# Patient Record
Sex: Female | Born: 2000 | Race: White | Hispanic: No | Marital: Single | Smoking: Never smoker
Health system: Southern US, Community
[De-identification: ages and names within clinical notes are randomized; demographics above are authoritative.]

---
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
## 2019-04-12 ENCOUNTER — Ambulatory Visit: Payer: 59 | Attending: Internal Medicine

## 2019-04-12 DIAGNOSIS — Z23 Encounter for immunization: Secondary | ICD-10-CM

## 2019-04-12 NOTE — Progress Notes (Signed)
   Covid-19 Vaccination Clinic  Name:  Amber Rhodes    MRN: [id number] DOB: 21-Nov-2000  04/12/2019  [name] was observed post Covid-19 immunization for 15 minutes without incident. She was provided with Vaccine Information Sheet and instruction to access the V-Safe system.   [name] was instructed to call 911 with any severe reactions post vaccine: Marland Kitchen Difficulty breathing  . Swelling of face and throat  . A fast heartbeat  . A bad rash all over body  . Dizziness and weakness   Immunizations Administered    Name Date Dose VIS Date Route   Pfizer COVID-19 Vaccine 04/12/2019  2:39 PM 0.3 mL 01/01/2019 Intramuscular   Manufacturer: [employer], [employer]   Lot: [id number]   NDC: [address]-1

## 2019-05-03 ENCOUNTER — Ambulatory Visit: Payer: 59 | Attending: Internal Medicine

## 2019-05-03 DIAGNOSIS — Z23 Encounter for immunization: Secondary | ICD-10-CM

## 2019-05-03 NOTE — Progress Notes (Signed)
   Covid-19 Vaccination Clinic  Name:  Amber Rhodes    MRN: [id number] DOB: 12-Jul-2000  05/03/2019  [name] was observed post Covid-19 immunization for 15 minutes without incident. She was provided with Vaccine Information Sheet and instruction to access the V-Safe system.   [name] was instructed to call 911 with any severe reactions post vaccine: Marland Kitchen Difficulty breathing  . Swelling of face and throat  . A fast heartbeat  . A bad rash all over body  . Dizziness and weakness   Immunizations Administered    Name Date Dose VIS Date Route   Pfizer COVID-19 Vaccine 05/03/2019  2:30 PM 0.3 mL 01/01/2019 Intramuscular   Manufacturer: [employer], [employer]   Lot: [id number]   NDC: [address]-0

## 2020-01-19 DIAGNOSIS — F32A Depression, unspecified: Secondary | ICD-10-CM | POA: Insufficient documentation

## 2020-01-19 DIAGNOSIS — F909 Attention-deficit hyperactivity disorder, unspecified type: Secondary | ICD-10-CM | POA: Insufficient documentation

## 2020-09-09 ENCOUNTER — Emergency Department
Admission: EM | Admit: 2020-09-09 | Discharge: 2020-09-09 | Disposition: A | Discharge status: Home / Self Care | Payer: 59 | Attending: Emergency Medicine | Admitting: Emergency Medicine

## 2020-09-09 ENCOUNTER — Emergency Department: Payer: 59

## 2020-09-09 ENCOUNTER — Other Ambulatory Visit: Payer: Self-pay

## 2020-09-09 DIAGNOSIS — M4306 Spondylolysis, lumbar region: Secondary | ICD-10-CM

## 2020-09-09 DIAGNOSIS — M545 Low back pain, unspecified: Secondary | ICD-10-CM | POA: Diagnosis present

## 2020-09-09 LAB — POC URINE PREG, ED: Preg Test, Ur: NEGATIVE

## 2020-09-09 MED ORDER — ACETAMINOPHEN 500 MG PO TABS
1000.0000 mg | ORAL_TABLET | Freq: Once | ORAL | Status: AC
  Administered 2020-09-09: 1000 mg via ORAL
  Filled 2020-09-09: qty 2

## 2020-09-09 MED ORDER — LIDOCAINE 5 % EX PTCH
1.0000 | MEDICATED_PATCH | CUTANEOUS | Status: DC
  Administered 2020-09-09: 1 via TRANSDERMAL
  Filled 2020-09-09: qty 1

## 2020-09-09 NOTE — ED Notes (Signed)
Patient transported to X-ray 

## 2020-09-09 NOTE — ED Triage Notes (Signed)
Pt states that over a month ago she was lifting 50 pounds in the gym and her lower back spasmed- states she saw an orthopedist and student health and they told her it was a pulled muscle. Pt states she's been taking muscle relaxer's and the pain has gotten worse and spread to hips. Pt states she cries herself to sleep bc of the pain. Pt is AOX4, NAD noted, pt ambulatory without difficulty.

## 2020-09-09 NOTE — ED Provider Notes (Signed)
[facility] Emergency Department Provider Note  ____________________________________________   Event Date/Time   First MD Initiated Contact with Patient 09/09/20 [phone]     (approximate)  I have reviewed the triage vital signs and the nursing notes.   HISTORY  Chief Complaint No chief complaint on file.   HPI Amber Rhodes is a 20 y.o. female without significant past medical history presents for assessment of some fairly severe persistent subacute low back pain.  Patient states that started a little over a month ago when she was lifting weights at the gym when she bent over and felt sudden spasm in her low mid back.  She states she went to an orthopedist back home in [place] assess where she is from and is currently a student here an orthopedist on x-ray that did not show any fracture or disc herniation and was told it was a muscle pain.  However she states that this was almost 3 weeks ago and since then the pain has not improved and she is already got a student health twice and has been prescribed muscle relaxants but these are also not helping.  She denies any interim injuries, incontinence, lower extremity weakness numbness or tingling, fevers, abdominal pain, upper back pain, rash, drug use, significant recent steroid use or other acute sick symptoms.  She has been taking Aleve and flexeril  without significant relief.  No other associated recent sick symptoms including vomiting, chest pain, cough, shortness of breath, fevers or other acute sick symptoms.  No other acute concerns at this time.         History reviewed. No pertinent past medical history.  There are no problems to display for this patient.   History reviewed. No pertinent surgical history.  Prior to Admission medications   Not on File    Allergies Patient has no allergy information on record.  History reviewed. No pertinent family history.  Social History Social History   Tobacco  Use   Smoking status: Never   Smokeless tobacco: Never    Review of Systems  Review of Systems  Constitutional:  Negative for chills and fever.  HENT:  Negative for sore throat.   Eyes:  Negative for pain.  Respiratory:  Negative for cough and stridor.   Cardiovascular:  Negative for chest pain.  Gastrointestinal:  Negative for vomiting.  Genitourinary:  Negative for dysuria.  Musculoskeletal:  Positive for back pain.  Skin:  Negative for rash.  Neurological:  Negative for seizures, loss of consciousness and headaches.  Psychiatric/Behavioral:  Negative for suicidal ideas.   All other systems reviewed and are negative.    ____________________________________________   PHYSICAL EXAM:  VITAL SIGNS: ED Triage Vitals  Enc Vitals Group     BP 09/09/20 1649 126/74     Pulse Rate 09/09/20 1649 98     Resp 09/09/20 1649 16     Temp 09/09/20 1649 98.7 F (37.1 C)     Temp Source 09/09/20 1649 Oral     SpO2 09/09/20 1649 100 %     Weight 09/09/20 1644 140 lb (63.5 kg)     Height 09/09/20 1644 5\' 7"  (1.702 m)     Head Circumference --      Peak Flow --      Pain Score 09/09/20 1649 10     Pain Loc --      Pain Edu? --      Excl. in [place]? --    Vitals:  09/09/20 1649  BP: 126/74  Pulse: 98  Resp: 16  Temp: 98.7 F (37.1 C)  SpO2: 100%   Physical Exam Vitals and nursing note reviewed.  Constitutional:      General: She is not in acute distress.    Appearance: She is well-developed.  HENT:     Head: Normocephalic and atraumatic.     Right Ear: External ear normal.     Left Ear: External ear normal.     Nose: Nose normal.     Mouth/Throat:     Mouth: Mucous membranes are moist.  Eyes:     Conjunctiva/sclera: Conjunctivae normal.  Cardiovascular:     Rate and Rhythm: Normal rate and regular rhythm.     Heart sounds: No murmur heard. Pulmonary:     Effort: Pulmonary effort is normal. No respiratory distress.     Breath sounds: Normal breath sounds.  Abdominal:      Palpations: Abdomen is soft.     Tenderness: There is no abdominal tenderness.  Musculoskeletal:     Cervical back: Neck supple.  Skin:    General: Skin is warm and dry.     Capillary Refill: Capillary refill takes less than 2 seconds.  Neurological:     Mental Status: She is alert and oriented to person, place, and time.  Psychiatric:        Mood and Affect: Mood normal.    Patient has full symmetric strength in her bilateral extremities.  Sensation is intact to light touch of both lower extremities.  Some mild tenderness over the L-spine and bilateral paralumbar lumbar muscles without any overlying skin changes.  Patient states straight leg testing makes it slightly worse although pain does not radiate past the knee on either side.  Patient was observed to ambulate with steady gait to the restroom and back.  2+ patellar reflexes.  She appears well perfused in both lower extremities. ____________________________________________   LABS (all labs ordered are listed, but only abnormal results are displayed)  Labs Reviewed  POC URINE PREG, ED   ____________________________________________  EKG  ____________________________________________  RADIOLOGY  ED MD interpretation: Plain film of the L-spine shows unilateral pars defect at L5 without evidence of spondylolisthesis.  No other acute fracture dislocation or acute abnormalities noted.  Official radiology report(s): DG Lumbar Spine Complete W/Bend  Result Date: 09/09/2020 CLINICAL DATA:  Lifting injury 1 month ago, back pain EXAM: LUMBAR SPINE - COMPLETE WITH BENDING VIEWS COMPARISON:  None. FINDINGS: Frontal, bilateral oblique, lateral neutral, lateral flexion, and lateral extension views of the lumbar spine are obtained. There are 5 non-rib-bearing lumbar type vertebral bodies with gentle right convex scoliosis centered at L3. Otherwise alignment is anatomic. There are no acute displaced fractures. Small limbus vertebra is seen  at the superior anterior margin of the L4 vertebral body. Probable unilateral pars defect on the right at L5, without evidence of spondylolisthesis, of uncertain acuity. No instability with flexion or extension. Disc spaces are well preserved. IMPRESSION: 1. Unilateral right-sided pars defect at L5, without evidence of spondylolisthesis. 2. Minimal right convex lumbar scoliosis. 3. Otherwise unremarkable lumbar spine. Electronically Signed   By: Sharlet Salina M.D.   On: 09/09/2020 19:39    ____________________________________________   PROCEDURES  Procedure(s) performed (including Critical Care):  Procedures   ____________________________________________   INITIAL IMPRESSION / ASSESSMENT AND PLAN / ED COURSE      Patient presents with above to history exam for assessment of subacute low back pain that she states began after  bending forward while lifting weights.  Has not been getting better and she feels an anything getting slightly worse and is aggravated by bending forward.  On exam she has no focal deficits or abnormal reflexes.  She has some mild tenderness over the bilateral lumbar muscles in the L-spine but no overlying skin changes.  She denies any constitutional symptoms and is afebrile hemodynamically stable on arrival.  I agree with patient that this point is likely less likely a strained muscle and more concerned about possible disc herniation and radicular nerve impingement.  Duration of symptoms with absence of any other associated sick symptoms and reassuring exam is less concerning at this time for abscess, discitis, acute cord compression or other immediate life-threatening process.  There is no evidence of cellulitis and I have a low suspicion for significant metabolic derangement.  Plain film of the L-spine shows unilateral pars defect at L5 without evidence of spondylolisthesis.  No other acute fracture dislocation or acute abnormalities noted.  Given findings on plain film  and suspect this may be causing some nerve impingement of patient's pain.  However given duration of symptoms with otherwise reassuring exam and no focal deficits do not believe she requires admission or other emergent work-up and I think she is stable for discharge with close outpatient orthopedic follow-up.  Discussed adding Lyrica to her pain management as well as Tylenol as she has not been taking either of these although she is currently prescribed Lyrica for anxiety.  Also advised adding lidocaine patch.  She is amenable with plan.  Discharged stable condition.  Strict return precautions advised and discussed.      ____________________________________________   FINAL CLINICAL IMPRESSION(S) / ED DIAGNOSES  Final diagnoses:  Acute low back pain without sciatica, unspecified back pain laterality  Pars defect of lumbar spine    Medications  lidocaine (LIDODERM) 5 % 1 patch (1 patch Transdermal Patch Applied 09/09/20 1941)  acetaminophen (TYLENOL) tablet 1,000 mg (1,000 mg Oral Given 09/09/20 1939)     ED Discharge Orders     None        Note:  This document was prepared using Dragon voice recognition software and may include unintentional dictation errors.    Gilles Chiquito, MD 09/09/20 [id number]

## 2020-10-04 ENCOUNTER — Ambulatory Visit: Payer: Self-pay | Admitting: *Deleted

## 2020-10-04 NOTE — Telephone Encounter (Signed)
Pt reports seen in ED 09/09/20 for severe lower back pain. States L5 fx.'Pars defect of lumbar spine.'  States worsening pain, now radiates to hips and legs. 8/10 constant. Was told to return to ED if pain continues or worsens. Advised ED. Pt states she was just trying to get advise on any other treatments. Reiterated need for ED eval. Pt is visiting. No PCP. May do tele visit.

## 2020-10-04 NOTE — Telephone Encounter (Signed)
Reason for Disposition  [1] SEVERE back pain (e.g., excruciating, unable to do any normal activities) AND [2] not improved 2 hours after pain medicine  Answer Assessment - Initial Assessment Questions 1. ONSET: "When did the pain begin?"      09/09/20 2. LOCATION: "Where does it hurt?" (upper, mid or lower back)     Lower back 3. SEVERITY: "How bad is the pain?"  (e.g., Scale 1-10; mild, moderate, or severe)   - MILD (1-3): doesn't interfere with normal activities    - MODERATE (4-7): interferes with normal activities or awakens from sleep    - SEVERE (8-10): excruciating pain, unable to do any normal activities      6-7/10 4. PATTERN: "Is the pain constant?" (e.g., yes, no; constant, intermittent)      constant 5. RADIATION: "Does the pain shoot into your legs or elsewhere?"     To hips and legs 6. CAUSE:  "What do you think is causing the back pain?"      L5 fx 7. BACK OVERUSE:  "Any recent lifting of heavy objects, strenuous work or exercise?"     Maybe in gym 8. MEDICATIONS: "What have you taken so far for the pain?" (e.g., nothing, acetaminophen, NSAIDS)     Lidocaine 9. NEUROLOGIC SYMPTOMS: "Do you have any weakness, numbness, or problems with bowel/bladder control?"     no 10. OTHER SYMPTOMS: "Do you have any other symptoms?" (e.g., fever, abdominal pain, burning with urination, blood in urine)       no  Protocols used: Back Pain-A-AH

## 2020-10-12 ENCOUNTER — Other Ambulatory Visit: Payer: Self-pay | Admitting: Neurosurgery

## 2020-10-12 DIAGNOSIS — M4306 Spondylolysis, lumbar region: Secondary | ICD-10-CM

## 2020-10-12 DIAGNOSIS — M5441 Lumbago with sciatica, right side: Secondary | ICD-10-CM

## 2020-10-12 DIAGNOSIS — M5416 Radiculopathy, lumbar region: Secondary | ICD-10-CM

## 2020-10-23 ENCOUNTER — Ambulatory Visit
Admission: RE | Admit: 2020-10-23 | Discharge: 2020-10-23 | Disposition: A | Discharge status: Home / Self Care | Payer: 59 | Source: Ambulatory Visit | Attending: Neurosurgery | Admitting: Neurosurgery

## 2020-10-23 ENCOUNTER — Other Ambulatory Visit: Payer: Self-pay

## 2020-10-23 DIAGNOSIS — M5416 Radiculopathy, lumbar region: Secondary | ICD-10-CM | POA: Diagnosis not present

## 2020-10-23 DIAGNOSIS — M4306 Spondylolysis, lumbar region: Secondary | ICD-10-CM | POA: Diagnosis present

## 2020-10-23 DIAGNOSIS — M5442 Lumbago with sciatica, left side: Secondary | ICD-10-CM | POA: Diagnosis present

## 2020-10-23 DIAGNOSIS — M5441 Lumbago with sciatica, right side: Secondary | ICD-10-CM | POA: Insufficient documentation

## 2021-05-04 ENCOUNTER — Ambulatory Visit
Admission: EM | Admit: 2021-05-04 | Discharge: 2021-05-04 | Disposition: A | Discharge status: Home / Self Care | Payer: 59

## 2021-05-04 ENCOUNTER — Ambulatory Visit (INDEPENDENT_AMBULATORY_CARE_PROVIDER_SITE_OTHER): Payer: 59

## 2021-05-04 ENCOUNTER — Ambulatory Visit: Payer: 59

## 2021-05-04 DIAGNOSIS — S62636A Displaced fracture of distal phalanx of right little finger, initial encounter for closed fracture: Secondary | ICD-10-CM

## 2021-05-04 DIAGNOSIS — S62639A Displaced fracture of distal phalanx of unspecified finger, initial encounter for closed fracture: Secondary | ICD-10-CM

## 2021-05-04 MED ORDER — NAPROXEN 375 MG PO TABS: 375.0000 mg | ORAL_TABLET | Freq: Two times a day (BID) | ORAL | 0 refills | Status: AC

## 2021-05-04 NOTE — ED Triage Notes (Signed)
Patient presents to Urgent Care with complaints of jamming her right pink finger on table she was cleaning yesterday. She states pain worse yesterday, continues to have some redness.  ?

## 2021-05-04 NOTE — Discharge Instructions (Addendum)
Wear finger splint with all activities. ?May remove with bathing. ?Take Naproxen 375 mg twice daily for 7 days. ?  ?

## 2021-05-04 NOTE — ED Provider Notes (Signed)
?[facility] ? ? ? ?CSN: [id number] ?Arrival date & time: 05/04/21  1548 ? ? ?  ? ?History   ?Chief Complaint ?Chief Complaint  ?Patient presents with  ? Finger Injury  ?  Right pinky   ? ? ?HPI ?Amber Rhodes is a 21 y.o. female.  ? ?HPI ?Patient presents today with a right fifth digit injury.  Patient reports jamming her pinky into a table while working yesterday.  She reports the pain was present yesterday however overnight significantly worsened and now her fifth digit is reddened.  Pain with range of motion.  Needs imaging to rule out fracture. ?History reviewed. No pertinent past medical history. ? ?Patient Active Problem List  ? Diagnosis Date Noted  ? Attention deficit hyperactivity disorder 01/19/2020  ? Chronic depression 01/19/2020  ? Urinary tract infectious disease 08/13/2017  ? Candidiasis of vagina 11/06/2016  ? Vaginitis 11/06/2016  ? Disorder of breast 05/27/2016  ? ? ?History reviewed. No pertinent surgical history. ? ?OB History   ?No obstetric history on file. ?  ? ? ? ?Home Medications   ? ?Prior to Admission medications   ?Medication Sig Start Date End Date Taking? Authorizing Provider  ?amitriptyline (ELAVIL) 10 MG tablet TAKE 1 TABLET BY MOUTH EVERY DAY AT BEDTIME AS NEEDED 04/10/21  Yes [provider]  ?amphetamine-dextroamphetamine (ADDERALL) 10 MG tablet PLEASE SEE ATTACHED FOR DETAILED DIRECTIONS 06/25/20  Yes [provider]  ?doxepin (SINEQUAN) 10 MG capsule TAKE 1 CAPSULE BY MOUTH EVERY DAY AT BEDTIME AS NEEDED 02/28/21  Yes [provider]  ?gabapentin (NEURONTIN) 100 MG capsule TAKE 2 CAPSULES BY MOUTH TWICE A DAY AS NEEDED 01/28/20  Yes [provider]  ?naproxen (NAPROSYN) 375 MG tablet Take 1 tablet (375 mg total) by mouth 2 (two) times daily. 05/04/21  Yes Scot Jun, FNP  ?amphetamine-dextroamphetamine (ADDERALL) 5 MG tablet TAKE 1 TABLET BY MOUTH TWICE A DAY SECOND DOSE NOT AFTER 5PM TAKE WITH 10MG  TABLET    [provider]   ?PROZAC 20 MG capsule  04/25/21   [provider]  ? ? ?Family History ?History reviewed. No pertinent family history. ? ?Social History ?Social History  ? ?Tobacco Use  ? Smoking status: Never  ? Smokeless tobacco: Never  ?Vaping Use  ? Vaping Use: Some days  ?Substance Use Topics  ? Alcohol use: Never  ? Drug use: Never  ? ? ? ?Allergies   ?Patient has no known allergies. ? ? ?Review of Systems ?Review of Systems ?Pertinent negatives listed in HPI  ? ?Physical Exam ?Triage Vital Signs ?ED Triage Vitals [05/04/21 1610]  ?Enc Vitals Group  ?   BP   ?   Pulse   ?   Resp   ?   Temp   ?   Temp src   ?   SpO2   ?   Weight   ?   Height   ?   Head Circumference   ?   Peak Flow   ?   Pain Score 3  ?   Pain Loc   ?   Pain Edu?   ?   Excl. in [place]?   ? ?No data found. ? ?Updated Vital Signs ?BP 120/76 (BP Location: Left Arm)   Pulse (!) 107   Temp 98.2 ?F (36.8 ?C) (Temporal)   Resp 18   SpO2 99%  ? ?Visual Acuity ?Right Eye Distance:   ?Left Eye Distance:   ?Bilateral Distance:   ? ?Right  Eye Near:   ?Left Eye Near:    ?Bilateral Near:    ? ?Physical Exam ?Vitals reviewed.  ?Constitutional:   ?   Appearance: Normal appearance.  ?HENT:  ?   Head: Normocephalic and atraumatic.  ?   Right Ear: Hearing normal.  ?   Left Ear: Hearing normal.  ?   Nose: Nose normal.  ?   Mouth/Throat:  ?   Mouth: Mucous membranes are moist.  ?Cardiovascular:  ?   Rate and Rhythm: Normal rate and regular rhythm.  ?Pulmonary:  ?   Effort: Pulmonary effort is normal.  ?   Breath sounds: Normal breath sounds.  ?Musculoskeletal:  ?     Arms: ? ?Neurological:  ?   Mental Status: She is alert.  ?Psychiatric:     ?   Attention and Perception: Attention normal.     ?   Mood and Affect: Mood normal.     ?   Speech: Speech normal.     ?   Behavior: Behavior normal.  ? ?UC Treatments / Results  ?Labs ?(all labs ordered are listed, but only abnormal results are displayed) ?Labs Reviewed - No data to display ? ?EKG ? ? ?Radiology ?DG Finger Little  Right ? ?Result Date: 05/04/2021 ?CLINICAL DATA:  Right fifth digit injury yesterday, limited range of motion EXAM: RIGHT LITTLE FINGER 2+V COMPARISON:  None. FINDINGS: Frontal, oblique, and lateral views of the right fifth digit are obtained. On the lateral view there is slight dorsal subluxation of the fifth distal interphalangeal joint. Slight cortical irregularity along the volar aspect of the base of the fifth distal phalanx could reflect a small avulsion fracture. No other acute bony abnormalities. Mild soft tissue swelling. IMPRESSION: 1. Suspected small avulsion fracture volar aspect base of the fifth distal phalanx. 2. Slight dorsal subluxation of the fifth distal interphalangeal joint. 3. Soft tissue swelling. Electronically Signed   By: Randa Ngo M.D.   On: 05/04/2021 17:08   ? ?Procedures ?Procedures (including critical care time) ? ?Medications Ordered in UC ?Medications - No data to display ? ?Initial Impression / Assessment and Plan / UC Course  ?I have reviewed the triage vital signs and the nursing notes. ? ?Pertinent labs & imaging results that were available during my care of the patient were reviewed by me and considered in my medical decision making (see chart for details). ? ?  ?Avulsion fracture of the fifth right finger ?Finger splint. ?Naproxen 375 mg BID X 7 days. ?Follow-up with [facility]. ?RTC PRN  ?Final Clinical Impressions(s) / UC Diagnoses  ? ?Final diagnoses:  ?Avulsion fracture of distal phalanx of finger, closed, initial encounter  ? ? ? ?Discharge Instructions   ? ?  ?Wear finger splint with all activities. ?May remove with bathing. ?Take Naproxen 375 mg twice daily for 7 days. ?  ? ? ? ? ?ED Prescriptions   ? ? Medication Sig Dispense Auth. Provider  ? naproxen (NAPROSYN) 375 MG tablet Take 1 tablet (375 mg total) by mouth 2 (two) times daily. 20 tablet Scot Jun, FNP  ? ?  ? ?PDMP not reviewed this encounter. ?  ?Scot Jun, FNP ?05/04/21 1735 ? ?

## 2023-01-28 IMAGING — MR MR LUMBAR SPINE W/O CM
5 series · 31 of 48 positions shown · non-contrast
Comparison: Radiograph from 09/09/2020.

CLINICAL DATA: Initial evaluation for low back pain since lifting
injury, pain and tingling in both lower extremities.

EXAM:
MRI LUMBAR SPINE WITHOUT CONTRAST
TECHNIQUE: Multiplanar, multisequence MR imaging of the lumbar spine was
performed. No intravenous contrast was administered.

[Series 5: T2 · sagittal · 4.0mm · 0.81mm/px · 6 of 17 slices shown (1 of 2)]
[im 1/17]
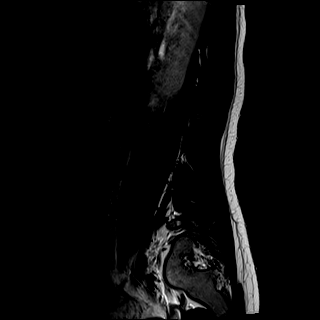
[im 4/17]
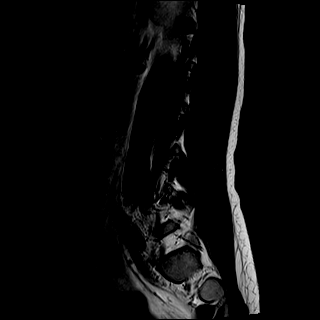
[im 7/17]
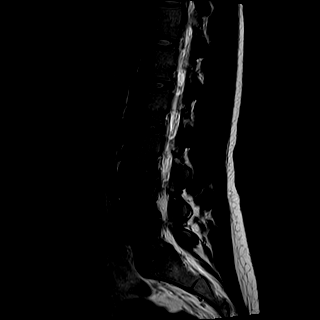
[im 10/17]
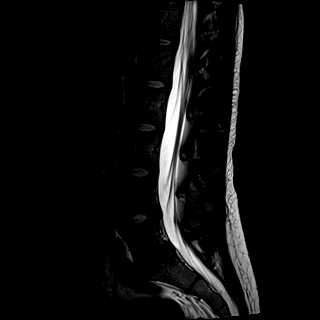
[im 13/17]
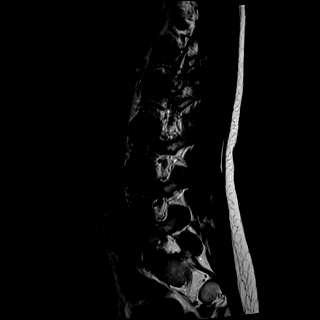
[im 17/17]
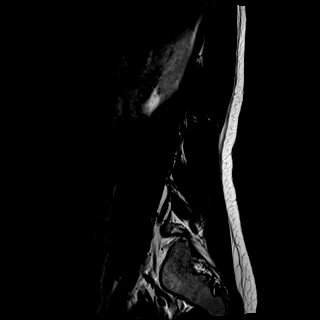

[Series 6: T1 · sagittal · 4.0mm · 0.81mm/px · 7 of 17 slices shown (1 of 2)]
[im 1/17]
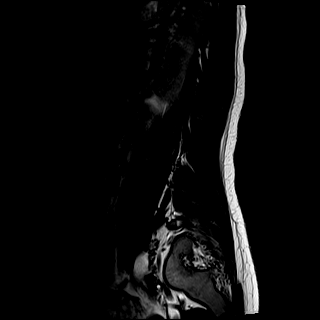
[im 3/17]
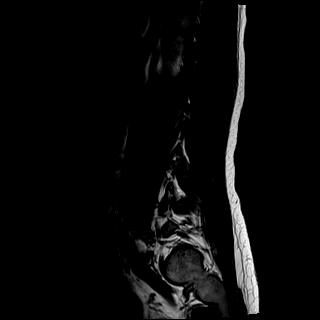
[im 6/17]
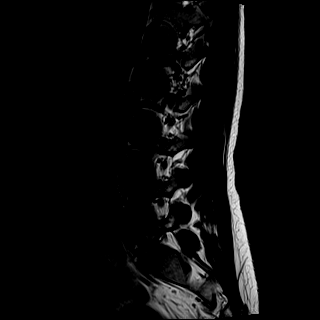
[im 9/17]
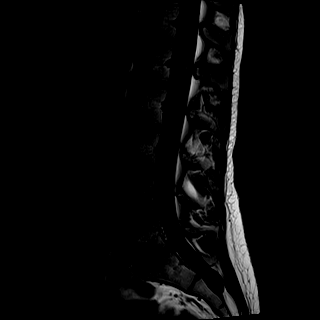
[im 11/17]
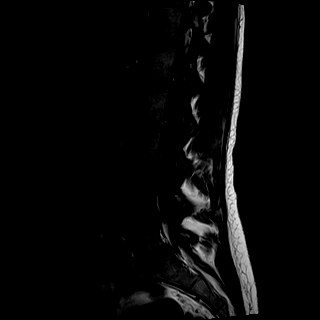
[im 14/17]
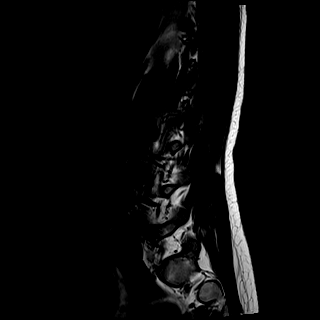
[im 17/17]
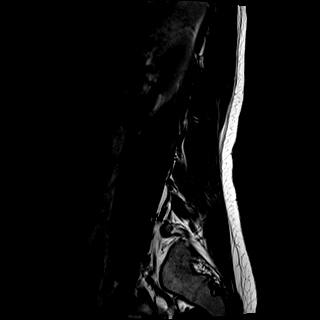

[Series 7: STIR · sagittal · 4.0mm · 0.41mm/px · 2 of 17 slices shown]
[im 1/17]
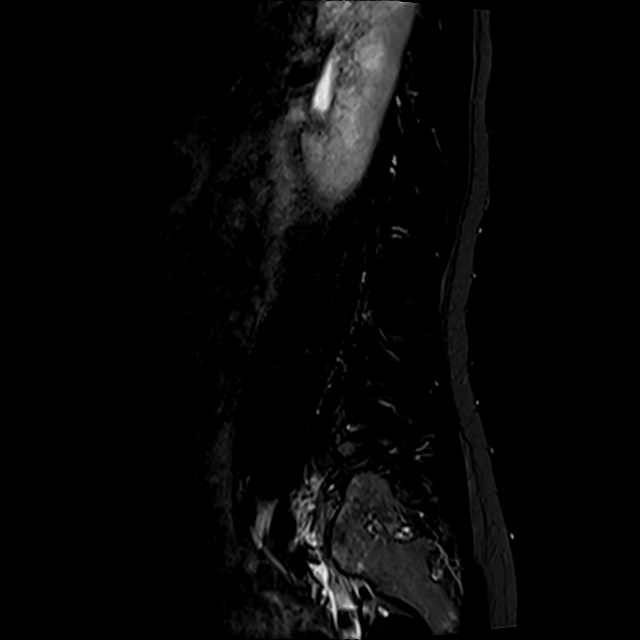
[im 3/17]
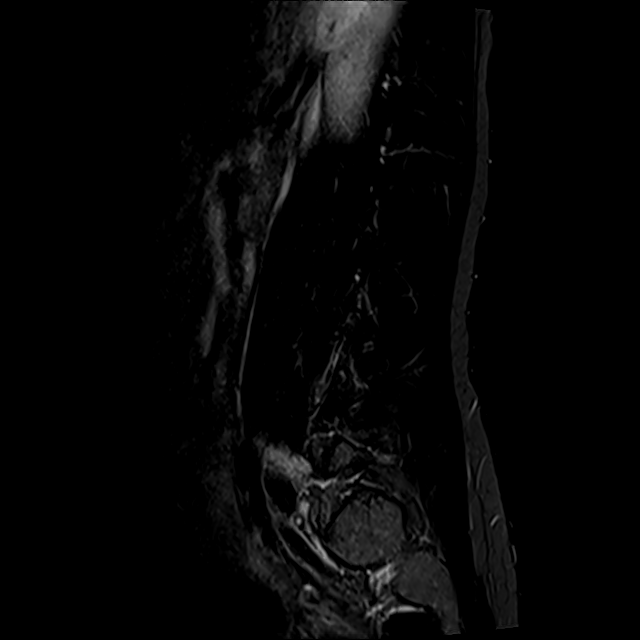

[Series 8: T2 · axial · 4.0mm · 0.78mm/px · z∈[-161,+57]mm · 8 of 37 slices shown (2 of 2)]
[im 1/37]
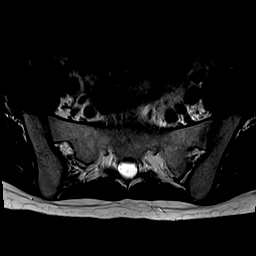
[im 6/37]
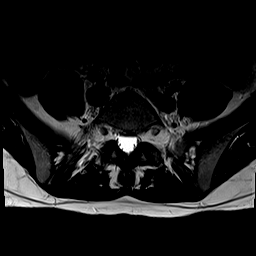
[im 12/37]
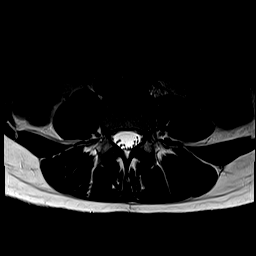
[im 17/37]
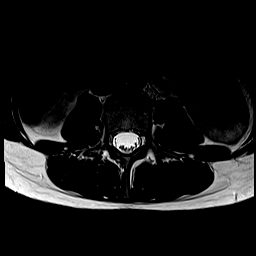
[im 20/37]
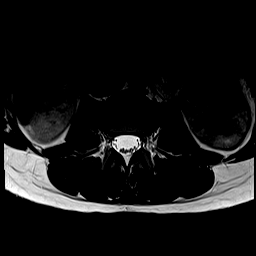
[im 25/37]
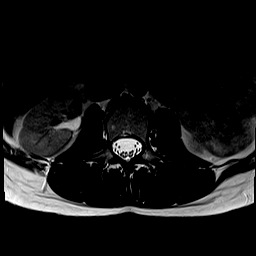
[im 31/37]
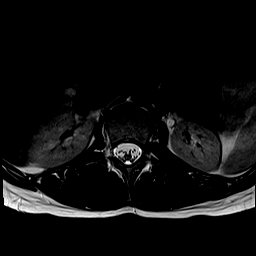
[im 37/37]
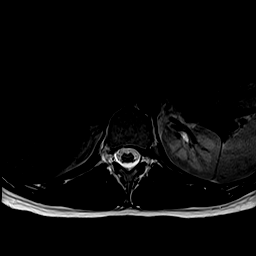

[Series 9: T1 · axial · 4.0mm · 0.39mm/px · z∈[-161,+57]mm · 8 of 37 slices shown (2 of 2)]
[im 1/37]
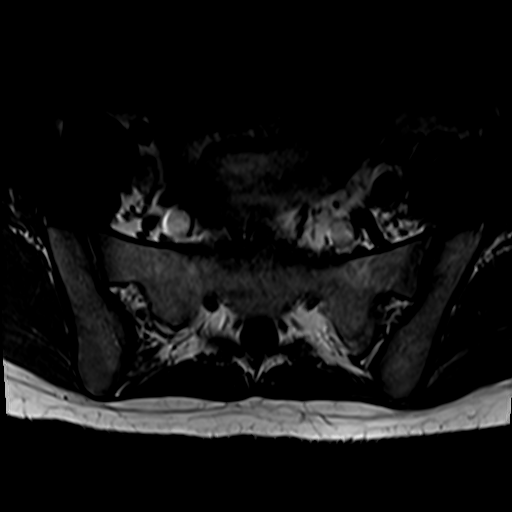
[im 6/37]
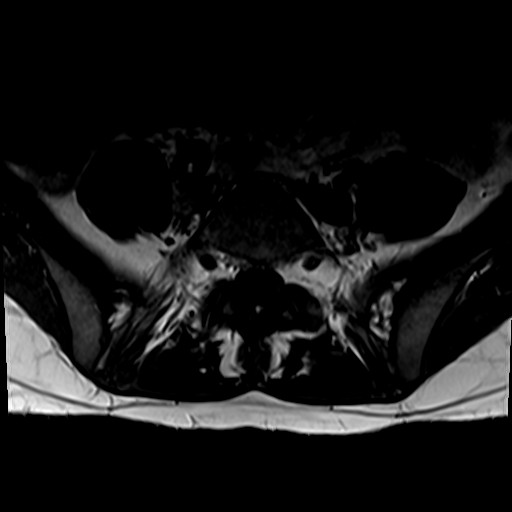
[im 12/37]
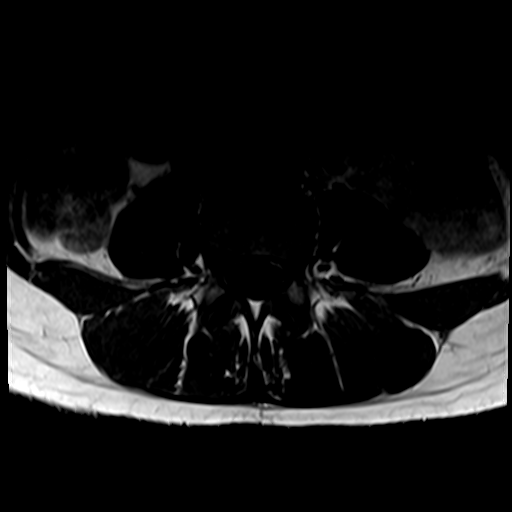
[im 17/37]
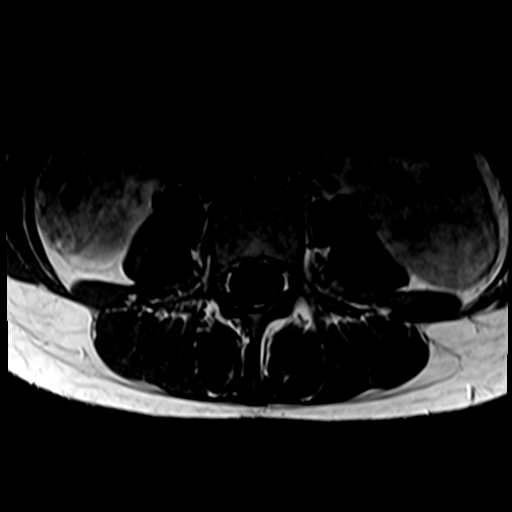
[im 20/37]
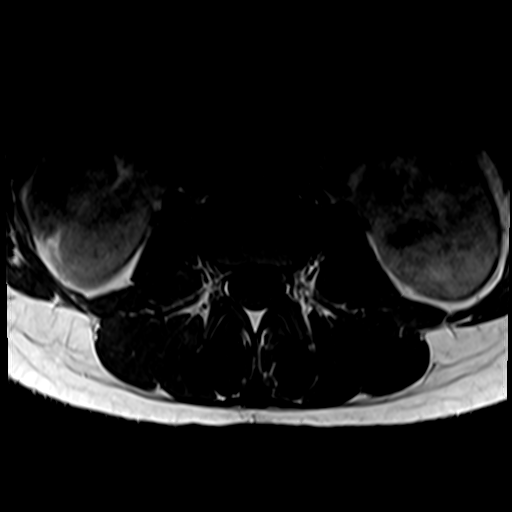
[im 25/37]
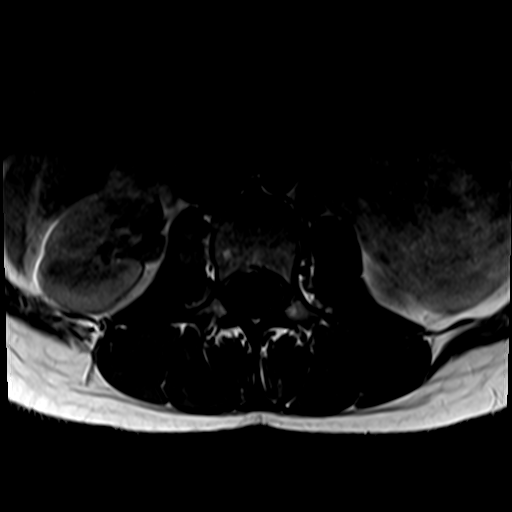
[im 31/37]
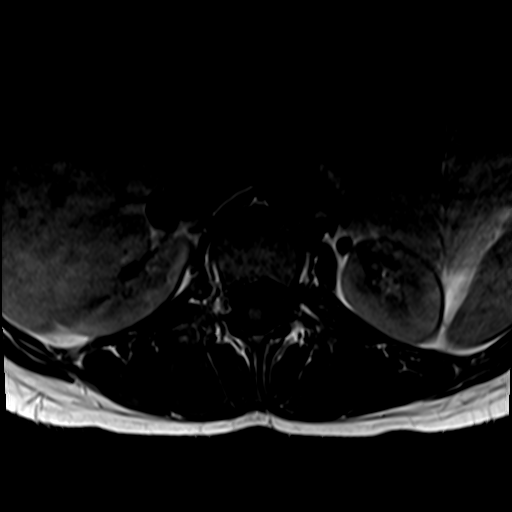
[im 37/37]
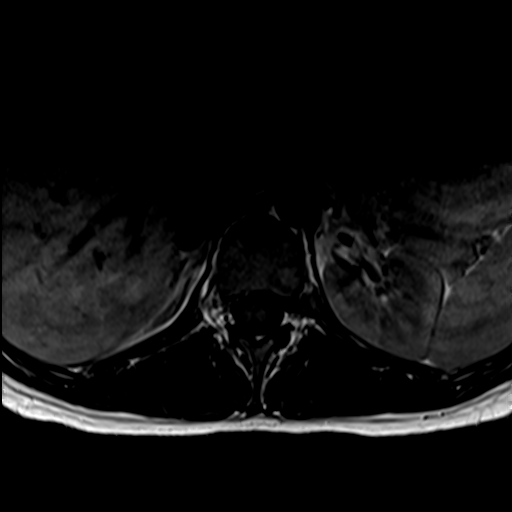

[31 of 48 positions shown; findings below may reference images not displayed]

FINDINGS: Segmentation: Standard. Lowest well-formed disc space labeled the
L5-S1 level.

Alignment: Previously identified right-sided pars defect noted,
better seen on prior radiograph. Associated trace 3 mm
spondylolisthesis of L5 on S1. Alignment otherwise normal with
preservation of the normal lumbar lordosis.

Vertebrae: Unilateral right-sided pars defect. Vertebral body height
otherwise maintained without acute or chronic fracture. Bone marrow
signal intensity normal. No discrete or worrisome osseous lesions or
abnormal marrow edema.

Conus medullaris and cauda equina: Conus extends to the L1-2 level.
Conus and cauda equina appear normal.

Paraspinal and other soft tissues: Unremarkable.

Disc levels:

L1-2:  Unremarkable.

L2-3:  Unremarkable.

L3-4:  Unremarkable.

L4-5: Disc desiccation with mild disc bulge. Superimposed small
central disc protrusion indents the ventral thecal sac (series 8,
image 28). Mild facet hypertrophy. No significant spinal stenosis.
Foramina remain patent.

L5-S1: Trace spondylolisthesis. Disc desiccation with mild disc
bulge. Small central annular fissure. Mild facet hypertrophy. No
significant canal or foraminal stenosis.
IMPRESSION: 1. Right-sided pars defect with associated trace 3 mm
spondylolisthesis of L5 on S1. Mild disc bulging with annular
fissure at this level without stenosis or neural impingement.
2. Mild disc bulge with small central disc protrusion and facet
hypertrophy at L4-5 without stenosis or neural impingement.
